# Patient Record
Sex: Male | Born: 2000 | Race: White | Hispanic: No | Marital: Single | Smoking: Never smoker
Health system: Southern US, Community
[De-identification: ages and names within clinical notes are randomized; demographics above are authoritative.]

## PROBLEM LIST (undated history)

## (undated) DIAGNOSIS — G43909 Migraine, unspecified, not intractable, without status migrainosus: Secondary | ICD-10-CM

## (undated) DIAGNOSIS — Z87710 Personal history of (corrected) hypospadias: Secondary | ICD-10-CM

## (undated) DIAGNOSIS — R21 Rash and other nonspecific skin eruption: Secondary | ICD-10-CM

## (undated) DIAGNOSIS — B351 Tinea unguium: Secondary | ICD-10-CM

## (undated) DIAGNOSIS — M9260 Juvenile osteochondrosis of tarsus, unspecified ankle: Secondary | ICD-10-CM

## (undated) HISTORY — DX: Juvenile osteochondrosis of tarsus, unspecified ankle: M92.60

## (undated) HISTORY — DX: Migraine, unspecified, not intractable, without status migrainosus: G43.909

## (undated) HISTORY — DX: Personal history of (corrected) hypospadias: Z87.710

## (undated) HISTORY — DX: Rash and other nonspecific skin eruption: R21

## (undated) HISTORY — DX: Tinea unguium: B35.1

---
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
## 2021-02-10 DIAGNOSIS — L408 Other psoriasis: Secondary | ICD-10-CM | POA: Diagnosis not present

## 2021-02-10 DIAGNOSIS — L409 Psoriasis, unspecified: Secondary | ICD-10-CM | POA: Diagnosis not present

## 2021-05-17 ENCOUNTER — Encounter: Payer: Self-pay | Admitting: Family Medicine

## 2021-06-19 IMAGING — DX DG HAND COMPLETE 3+V*R*
4 series · 4 of 4 positions shown · non-contrast
Comparison: None.

CLINICAL DATA: Right hand pain

EXAM:
RIGHT HAND - COMPLETE 3+ VIEW

[hand pa]
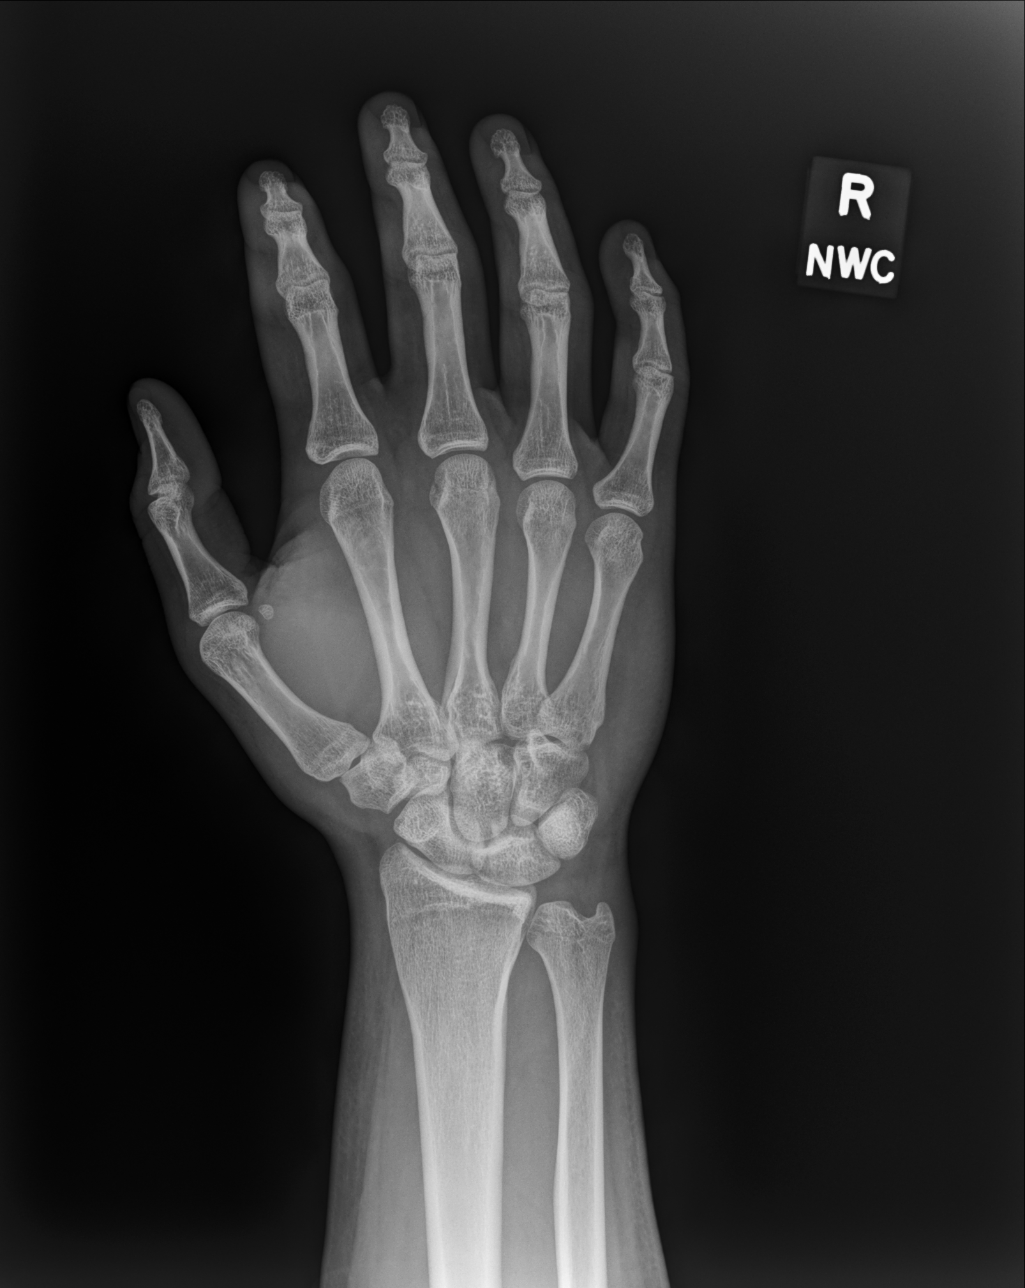

[hand oblique]
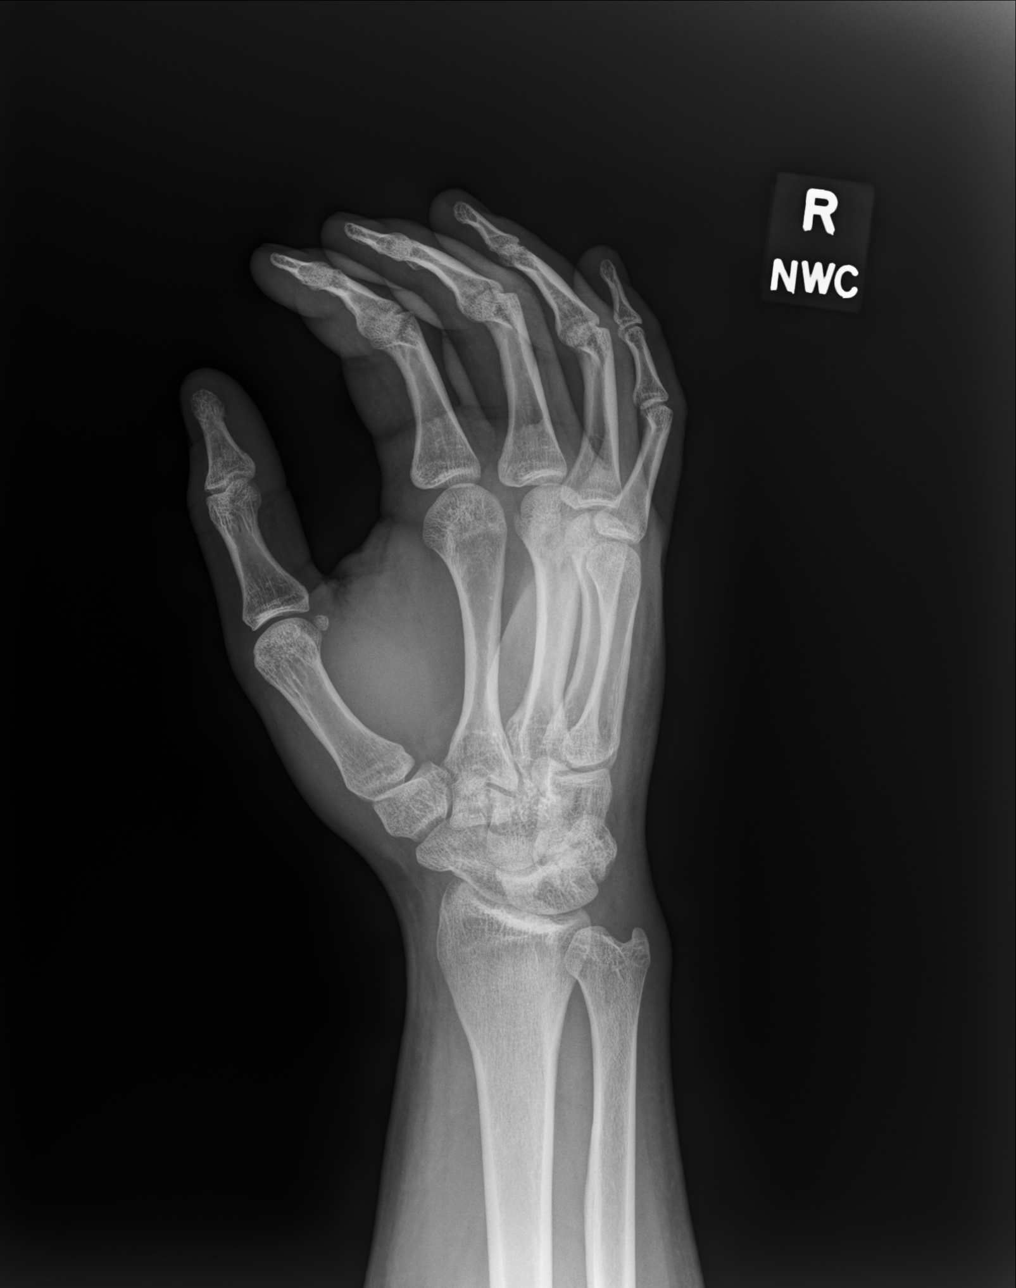

[hand lat]
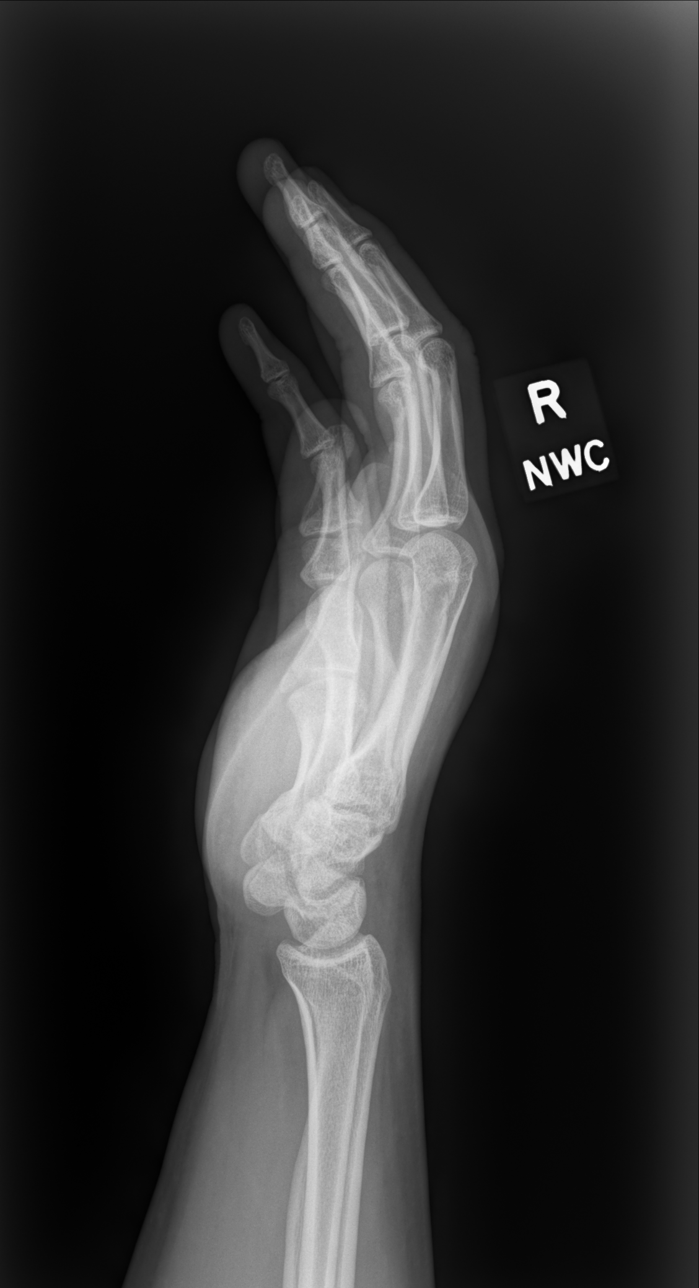

[navicular pa]
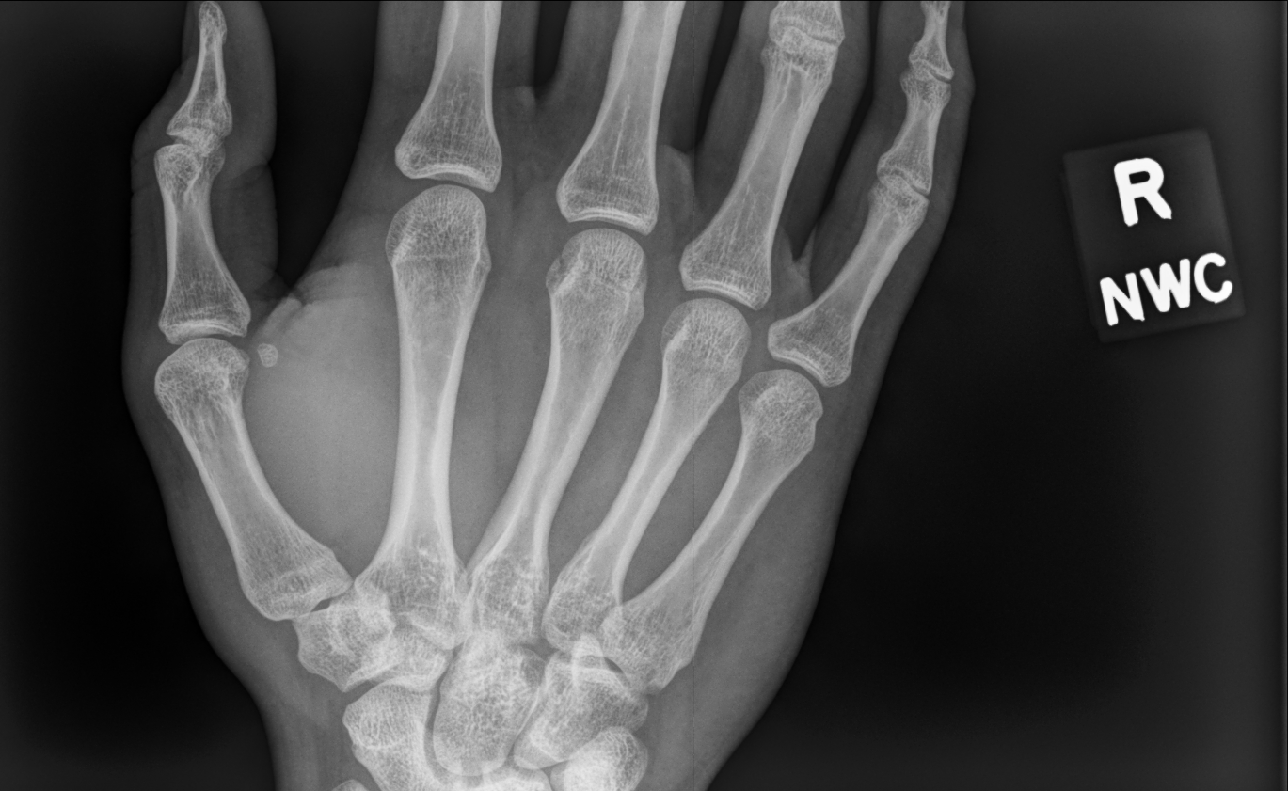

[4 of 4 positions shown; findings below may reference images not displayed]

FINDINGS: No subluxation or radiopaque foreign body. Questionable nondisplaced
fracture lucency at the head of the third metacarpal.
IMPRESSION: Questionable fracture lucency at the head of the third metacarpal.
Correlate for point tenderness to this region.

## 2021-10-21 DIAGNOSIS — R21 Rash and other nonspecific skin eruption: Secondary | ICD-10-CM | POA: Diagnosis not present

## 2021-10-21 DIAGNOSIS — J301 Allergic rhinitis due to pollen: Secondary | ICD-10-CM | POA: Diagnosis not present

## 2021-10-21 DIAGNOSIS — J3089 Other allergic rhinitis: Secondary | ICD-10-CM | POA: Diagnosis not present

## 2021-12-29 NOTE — Progress Notes (Signed)
? ?  I, Christoper Fabian, LAT, ATC, am serving as scribe for Dr. Clementeen Graham. ? ?Torion Hulgan is a 21 y.o. male who presents to [facility] at [facility] today for evaluation in preparation for joining the [employer].  He was last seen by Dr. Denyse Amass on 11/25/19 for a R 3rd MC fracture f/u and prior to that in 2019 by Dr. Katrinka Blazing for a concussion.  Today, pt reports that he went to [facility] last Friday and was told that he needs to provide them w/ his complete medication history (which he has taken care of) and documentation that he no longer has a concussion.  His R hand fracture from 2021 has completely healed and he is having no issue w/ that.  He is wanting to join the [employer]. ? ?He is completely asymptomatic from his concussion and from his hand fracture. ? ? ?Pertinent review of systems: No fevers or chills ? ?Relevant historical information: History of migraine ? ? ?Exam:  ?BP 104/72 (BP Location: Right Arm, Patient Position: Sitting, Cuff Size: Large)   Pulse 95   Ht 6\' 1"  (1.854 m)   Wt 222 lb 6.4 oz (100.9 kg)   SpO2 97%   BMI 29.34 kg/m?  ?General: Well Developed, well nourished, and in no acute distress.  ? ?MSK: Right hand normal.  Nontender normal hand motion ? ?Neuro alert and oriented normal coordination balance and gait.  Normal speech thought process and affect. ? ? ? ?Assessment and Plan: ?21 y.o. male with concussion history.  Concussion occurred in 2019 and is completely asymptomatic at this point.  Patient is cleared for active 2020 service per my professional medical opinion. ? ?Hand fracture: Clinically significantly improved.  Asymptomatic again at this point.  Again cleared for active [employer]. ? ?Letter written.  Recheck back with me as needed. ? ? ? ? ?Discussed warning signs or symptoms. Please see discharge instructions. Patient expresses understanding. ? ? ?The above documentation has been reviewed and is accurate and complete [employer], M.D. ? ? ?

## 2021-12-30 ENCOUNTER — Encounter: Payer: Self-pay | Admitting: Family Medicine

## 2021-12-30 ENCOUNTER — Ambulatory Visit (INDEPENDENT_AMBULATORY_CARE_PROVIDER_SITE_OTHER): Payer: BC Managed Care – PPO | Admitting: Family Medicine

## 2021-12-30 VITALS — BP 104/72 | HR 95 | Ht 73.0 in | Wt 222.4 lb

## 2021-12-30 DIAGNOSIS — Z8781 Personal history of (healed) traumatic fracture: Secondary | ICD-10-CM | POA: Diagnosis not present

## 2021-12-30 DIAGNOSIS — Z87820 Personal history of traumatic brain injury: Secondary | ICD-10-CM | POA: Diagnosis not present

## 2021-12-30 NOTE — Patient Instructions (Signed)
Good to see you. ? ?Congratulations on your efforts to join the [employer]. ? ?Follow-up as needed. ?

## 2022-06-27 ENCOUNTER — Encounter: Payer: Self-pay | Admitting: *Deleted

## 2022-09-15 ENCOUNTER — Encounter: Payer: Self-pay | Admitting: *Deleted

## 2024-02-01 ENCOUNTER — Encounter: Payer: Self-pay | Admitting: Internal Medicine

## 2024-02-01 ENCOUNTER — Other Ambulatory Visit: Payer: Self-pay

## 2024-02-01 ENCOUNTER — Encounter (HOSPITAL_COMMUNITY): Payer: Self-pay

## 2024-02-01 ENCOUNTER — Other Ambulatory Visit (HOSPITAL_BASED_OUTPATIENT_CLINIC_OR_DEPARTMENT_OTHER): Payer: Self-pay

## 2024-02-01 ENCOUNTER — Ambulatory Visit (INDEPENDENT_AMBULATORY_CARE_PROVIDER_SITE_OTHER): Payer: Self-pay | Admitting: Internal Medicine

## 2024-02-01 ENCOUNTER — Other Ambulatory Visit (HOSPITAL_COMMUNITY): Payer: Self-pay

## 2024-02-01 VITALS — BP 136/88 | HR 86 | Temp 98.4°F | Ht 73.0 in | Wt 247.4 lb

## 2024-02-01 DIAGNOSIS — L400 Psoriasis vulgaris: Secondary | ICD-10-CM | POA: Diagnosis not present

## 2024-02-01 MED ORDER — CLOBETASOL PROPIONATE 0.05 % EX CREA: 1.0000 | TOPICAL_CREAM | Freq: Two times a day (BID) | CUTANEOUS | 0 refills | Status: DC

## 2024-02-01 MED ORDER — OTEZLA 10 & 20 & 30 MG PO TBPK
ORAL_TABLET | ORAL | 2 refills | Status: DC
  Filled 2024-02-01: qty 55, 28d supply, fill #0

## 2024-02-01 NOTE — Patient Instructions (Signed)
???   5-Day Titration Schedule To minimize gastrointestinal side effects, initiate therapy with a gradual dose escalation:[email or web address] Day Morning Dose Evening Dose  1 10 mg --  2 10 mg 10 mg  3 10 mg 20 mg  4 20 mg 20 mg  5 20 mg 30 mg  ? Maintenance Dose (Day 6 and Beyond) After completing the titration, the maintenance dosage ZO:[email or web address] 30 mg orally twice daily (morning and evening)

## 2024-02-01 NOTE — Progress Notes (Unsigned)
 [facility]  Phone: [phone]  - Medical Office Visit -  Visit Date: 02/01/2024 Patient: Jeffery Warren   DOB: 2001-04-05   23 y.o. Male  MRN: [id number] Patient Care Team: Anthon Kins, MD as PCP - General (Internal Medicine) Today's Health Care Provider: Anthon Kins, MD  ===========================================    Chief Complaint / Reason for Visit: new pt (Pt is present for est care with pt. ) and Rash (Hx of psoriasis states he know that is what it is.)  Background: 23 y.o. male who has Migraines; Rash; Onychomycosis; Hypospadias; Concussion; and Closed fracture of right hand on their problem list. History of Present Illness 23 year old male with psoriasis who presents with worsening skin symptoms.  He has had psoriasis since the age of 86, primarily affecting his scalp, behind the ears, in the ears, and more recently, the eyebrows. The condition has been spreading and worsening, with increased redness, flakiness, and itchiness. He also notes possible acceleration of hair loss.  He was previously treated with Skyrizi for about six months, which he stopped one to two years ago when the pharmacy ceased filling the prescription. He recalls that the specialty pharmacy did not follow up after two uses, despite insurance coverage. He mentions a past staph infection behind his ears while on Skyrizi, possibly related to the medication.  Currently, he intermittently uses steroid creams to manage irritation and practices good scalp hygiene with showers and scrubbing. He has not tried Otezla  or other medications recently, but recalls using an alcohol-based foam topical treatment around the age of 12, which was expensive and not covered by insurance.  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 No joint involvement or back pain associated with his psoriasis.  Medications updated/reviewed: Current  Outpatient Medications on File Prior to Visit  Medication Sig   rizatriptan  (MAXALT ) 10 MG tablet Take 10 mg by mouth as needed for migraine. May repeat in 2 hours if needed (Patient not taking: Reported on 02/01/2024)   STELARA 90 MG/ML SOSY injection Inject 1 mL into the skin as directed. (Patient not taking: Reported on 02/01/2024)   No current facility-administered medications on file prior to visit.  There are no discontinued medications. Current Meds  Medication Sig   Apremilast  (OTEZLA ) 10 & 20 & 30 MG TBPK ??? 5-Day Titration Schedule To minimize gastrointestinal side effects, initiate therapy with a gradual dose escalation:[email or web address] Day Morning Dose Evening Dose 1 10 mg - 2 10 mg 10 mg 3 10 mg 20 mg 4 20 mg 20 mg 5 20 mg 30 mg ? Maintenance Dose (Day 6 and Beyond) After completing the titration, the maintenance dosage WG:[email or web address] 30 mg orally twice daily (morning and evening)   clobetasol  cream (TEMOVATE ) 0.05 % Apply 1 Application topically 2 (two) times daily.    Allergies:   Allergies as of 02/01/2024   (No Known Allergies)   Past Medical History:  has a past medical history of Migraines, Personal history of repaired hypospadias, Rash, Sever's disease, and Toenail fungus. Past Surgical History:   has a past surgical history that includes Hypospadius correction (2004); fiscal repair  penis (11/2018); and hyperspadia repair (2019). Social History:   reports that he has never smoked. He has never used smokeless tobacco. He reports current alcohol use. He reports that he does not use drugs. Family History:  family history includes Alcohol abuse in his maternal grandfather; Arthritis in his maternal  grandmother and paternal grandmother; Asthma in his mother; Cirrhosis in his maternal grandfather; Diabetes in his paternal grandmother; Heart disease in his maternal grandmother; Hyperlipidemia in his maternal grandmother; Hypertension in his mother and paternal  grandmother; Migraines in his brother and sister; Obesity in his paternal grandmother. Depression Screen and Health Maintenance:    02/01/2024    2:02 PM 11/01/2019    9:51 AM 02/04/2019    9:26 PM  PHQ 2/9 Scores  PHQ - 2 Score 0 0 0  PHQ- 9 Score 0     Health Maintenance  Topic Date Due   HIV Screening  Never done   Hepatitis C Screening  Never done   COVID-19 Vaccine (1 - 2024-25 season) Never done   DTaP/Tdap/Td (7 - Td or Tdap) 01/31/2025 (Originally 04/10/2023)   INFLUENZA VACCINE  05/03/2024   Pneumococcal Vaccine 109-37 Years old  Completed   HPV VACCINES  Completed   Meningococcal B Vaccine  Completed   Immunization History  Administered Date(s) Administered   DTaP 04/17/2002, 06/27/2002, 09/04/2002, 05/16/2003, 06/29/2006   HIB (PRP-OMP) 04/17/2002, 06/27/2002, 09/04/2002, 02/19/2003   HPV 9-valent 04/10/2018, 02/04/2019, 08/06/2019   Hepatitis A 09/13/2007, 10/24/2008   Hepatitis B 2001-10-[birth year], 04/17/2002, 09/04/2002   Hpv-Unspecified 08/17/2016   IPV 04/17/2002, 06/27/2002, 09/04/2002, 06/29/2006   Influenza,inj,Quad PF,6+ Mos 10/11/2017, 08/16/2019   MMR 02/19/2003, 06/29/2006   Meningococcal B, OMV 02/04/2019, 12/03/2019   Meningococcal Conjugate 04/09/2013   Meningococcal Mcv4o 12/03/2019   Pneumococcal Conjugate-13 04/17/2002, 06/27/2002, 09/04/2002, 09/18/2003   Tdap 04/09/2013   Varicella 02/19/2003, 06/29/2006     Objective   Physical ExamBP 136/88   Pulse 86   Temp 98.4 F (36.9 C) (Temporal)   Ht 6\' 1"  (1.854 m)   Wt 247 lb 6.4 oz (112.2 kg)   SpO2 98%   BMI 32.64 kg/m  Wt Readings from Last 10 Encounters:  02/01/24 247 lb 6.4 oz (112.2 kg)  12/30/21 222 lb 6.4 oz (100.9 kg) (97%, Z= 1.90)*  01/28/20 230 lb (104.3 kg) (99%, Z= 2.17)*  11/25/19 233 lb 12.8 oz (106.1 kg) (99%, Z= 2.25)*  11/18/19 234 lb (106.1 kg) (99%, Z= 2.26)*  11/18/19 234 lb 3.2 oz (106.2 kg) (99%, Z= 2.26)*  11/01/19 235 lb (106.6 kg) (99%, Z= 2.28)*  02/04/19 220 lb (99.8  kg) (98%, Z= 2.14)*  12/13/18 222 lb 3.2 oz (100.8 kg) (99%, Z= 2.21)*  07/04/18 206 lb (93.4 kg) (98%, Z= 2.00)*   * Growth percentiles are based on CDC (Boys, 2-20 Years) data.  Vital signs reviewed.  Nursing notes reviewed. Weight trend reviewed. Abnormalities and problem-specific physical exam findings:   General Appearance:  Well developed, well nourished, well-groomed, healthy-appearing male with Body mass index is 32.64 kg/m. No acute distress appreciable.   Skin: Clear and well-hydrated. Pulmonary:  Normal work of breathing at rest, no respiratory distress apparent. SpO2: 98 %  Musculoskeletal: He demonstrates smooth and coordinated movements throughout all major joints.All extremities are intact.  Neurological:  Awake, alert, oriented, and engaged.  No obvious focal neurological deficits or cognitive impairments.  Sensorium seems unclouded.  Psychiatric:  Appropriate mood, pleasant and cooperative demeanor, cheerful and engaged during the exam  Reviewed Results & Data Results     No results found for any visits on 02/01/24.  No visits with results within 1 Year(s) from this visit.  Latest known visit with results is:  Lab on 10/27/2020  Component Date Value   SARS-CoV-2, NAA 10/27/2020 Detected (A)    SARS-CoV-2, NAA 2  DAY TAT 10/27/2020 Performed    No image results found.   No results found.  Patient was never admitted. 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 Symptoms include spreading, increased erythema, flakiness, and pruritus. Previous Skyrizi treatment was discontinued due to pharmacy issues. He currently uses intermittent topical corticosteroids and has no joint involvement. Otezla  is preferred for its favorable safety profile and oral administration. Potential side  effects and insurance challenges were discussed. He prefers to avoid Sheilla Der and is open to Otezla . Informed consent included discussion of JAK inhibitors' risks and Otezla 's safety benefits. Medical decision-making favored Otezla  over Skyrizi in part because skyrizi wasn't fully effective previously. Submit prior authorization for Otezla . Prescribe clobetasol  for topical use on affected areas. Refer to dermatology for further evaluation and management. Advise him to contact the office if symptoms worsen or joint involvement occurs. Send prescription to [facility] pharmacy for insurance assistance and clobetasol  prescription to [facility].  Prior authorization support letter in communication section:   I am writing to request prior authorization for Otezla  (apremilast ) for my patient, [Patient's Full Name], who has been diagnosed with moderate-to-severe plaque psoriasis affecting the scalp, ears, face, and eyebrows. Despite previous treatment with Skyrizi (risankizumab) for six months, the patient discontinued therapy approximately 1-2 years ago due to insurance coverage issues. Since then, the patient's condition has worsened, leading to increased flakiness, itching, and accelerated hair loss. Clinical History and Current Condition Diagnosis: Moderate-to-severe plaque psoriasis Affected Areas: Scalp, ears, face, and eyebrows Current Symptoms: Increased flakiness, itching, and hair loss Previous Treatments: Skyrizi (risankizumab): Discontinued 1-2 years ago due to insurance coverage issues Topical Steroid Creams: Used intermittently with limited efficacy Topical Alcohol-Based Foam: Provided minimal relief but was prohibitively expensive Rationale for Otezla  (apremilast ) Otezla  (apremilast ) is an oral phosphodiesterase 4 (PDE4) inhibitor approved for the treatment of moderate-to-severe plaque psoriasis in adults who are candidates for phototherapy or systemic therapy. It is  considered appropriate for patients who have not responded adequately to topical treatments or other systemic therapies. Given the patient's history of inadequate response to topical therapies and the discontinuation of biologic treatment due to insurance issues, Otezla  offers a viable oral alternative that can be initiated promptly without the need for injections. Supporting Documentation Clinical Notes: Charles Connor relevant clinical notes detailing the patient's condition and treatment history] Photographs: [Attach photographs illustrating the severity of the affected areas] Previous Medication Records: [facility] records of prior treatments and their outcomes] Conclusion Initiating Otezla  (apremilast ) is medically necessary to manage the patient's worsening psoriasis and to prevent further complications, including potential scarring and permanent hair loss. I respectfully request that you approve this prior authorization request to facilitate timely and effective treatment for my patient. Should you require any additional information or documentation, please do not hesitate to contact my office at [facility] Number]. Thank you for your attention to this matter.   ED Discharge Orders          Ordered    Apremilast  (OTEZLA ) 10 & 20 & 30 MG TBPK       Note to Pharmacy: At refill shift to 30 mg twice daily continuous   02/01/24 1501    Ambulatory referral to Dermatology        02/01/24 1501    clobetasol  cream (TEMOVATE )  0.05 %  2 times daily        02/01/24 1501          Diagnoses and all orders for this visit: Plaque psoriasis -     Apremilast  (OTEZLA ) 10 & 20 & 30 MG TBPK; ??? 5-Day Titration Schedule To minimize gastrointestinal side effects, initiate therapy with a gradual dose escalation:[email or web address] Day Morning Dose Evening Dose 1 10 mg - 2 10 mg 10 mg 3 10 mg 20 mg 4 20 mg 20 mg 5 20 mg 30 mg ? Maintenance Dose (Day 6 and Beyond) After completing the titration, the maintenance dosage  VW:[email or web address] 30 mg orally twice daily (morning and evening) -     Ambulatory referral to Dermatology -     clobetasol  cream (TEMOVATE ) 0.05 %; Apply 1 Application topically 2 (two) times daily.   Recommended follow up: No follow-ups on file. Future Appointments  Date Time Provider Department Center  03/04/2024  3:00 PM Anthon Kins, MD [facility]     Medical Decision Making: 1 or more chronic illnesses with exacerbation,  progression, or side effects of treatment Prescription drug management   Prior authorize paperwork for otezla  completed as communication to prior authorization team I have personally spent 33 minutes involved in face-to-face and non-face-to-face activities for this patient on the day of the visit. Professional time spent includes the following activities: Preparing to see the patient (review of tests), Obtaining and/or reviewing separately obtained history (admission/discharge record), Performing a medically appropriate examination and/or evaluation , Ordering medications/tests/procedures, referring and communicating with other health care professionals, Documenting clinical information in the EMR, Independently interpreting results (not separately reported), Communicating results to the patient/family/caregiver, Counseling and educating the patient/family/caregiver and Care coordination (not separately reported).         Additional notes: This document was synthesized by artificial intelligence (Abridge) using HIPAA-compliant recording of the clinical interaction;   We discussed the use of AI scribe software for clinical note transcription with the patient, who gave verbal consent to proceed.    Additional Info: This encounter employed state-of-the-art, real-time, collaborative documentation. The patient actively reviewed and assisted in updating their electronic medical record on a shared screen, ensuring transparency and facilitating joint  problem-solving for the problem list, overview, and plan. This approach promotes accurate, informed care. The treatment plan was discussed and reviewed in detail, including medication safety, potential side effects, and all patient questions. We confirmed understanding and comfort with the plan. Follow-up instructions were established, including contacting the office for any concerns, returning if symptoms worsen, persist, or new symptoms develop, and precautions for potential emergency department visits.  Initial Appointment Goals:  This initial visit focused on establishing a foundation for the patient's care. We collaboratively reviewed his medical history and medications in detail, updating the chart as shown in the encounter. Given the extensive information, we prioritized addressing his most pressing concerns, which he reported were: new pt (Pt is present for est care with pt. ) and Rash (Hx of psoriasis states he know that is what it is.)  While the complexity of the patient's medical picture may necessitate further evaluation in subsequent visits, we were able to develop a preliminary care plan together. To expedite a comprehensive plan at the next visit, we encouraged the patient to gather relevant medical records from previous providers. This collaborative approach will ensure a more complete understanding of the patient's health and inform the development of a personalized care plan. We look forward to continuing the conversation and  working together with the patient on achieving his health goals.   Collaborative Documentation:  Today's encounter utilized real-time, dynamic patient engagement.  Patients actively participate by directly reviewing and assisting in updating their medical records through a shared screen. This transparency empowers patients to visually confirm chart updates made by the healthcare provider.  This collaborative approach facilitates problem management as we jointly update  the problem list, problem overview, and assessment/plan. Ultimately, this process enhances chart accuracy and completeness, fostering shared decision-making, patient education, and informed consent for tests and treatments.  Collaborative Treatment Planning:  Treatment plans were discussed and reviewed in detail.  Explained medication safety and potential side effects.  Encouraged participation and answered all patient questions, confirming understanding and comfort with the plan. Encouraged patient to contact our office if they have any questions or concerns. Agreed on patient returning to office if symptoms worsen, persist, or new symptoms develop.  ----------------------------------------------------- Anthon Kins, MD  02/03/2024 11:58 AM  [facility]:  [phone]

## 2024-02-02 ENCOUNTER — Other Ambulatory Visit: Payer: Self-pay

## 2024-02-03 ENCOUNTER — Encounter: Payer: Self-pay | Admitting: Internal Medicine

## 2024-02-07 ENCOUNTER — Telehealth: Payer: Self-pay

## 2024-02-07 ENCOUNTER — Other Ambulatory Visit: Payer: Self-pay

## 2024-02-07 DIAGNOSIS — L400 Psoriasis vulgaris: Secondary | ICD-10-CM

## 2024-02-07 MED ORDER — OTEZLA 10 & 20 & 30 MG PO TBPK
ORAL_TABLET | ORAL | 2 refills | Status: AC
  Filled 2024-02-07: qty 55, 25d supply, fill #0

## 2024-02-07 MED ORDER — OTEZLA 10 & 20 & 30 MG PO TBPK: ORAL_TABLET | ORAL | 2 refills | Status: DC

## 2024-02-07 NOTE — Telephone Encounter (Signed)
 Please review and advise   Copied from CRM [id number]. Topic: Clinical - Prescription Issue >> Feb 07, 2024  9:34 AM Lizabeth Riggs wrote: Reason for CRM:  Apremilast  (OTEZLA ) 10 & 20 & 30 MG TBPK - [facility] needs a new order that states how to take this medication. The order they have is unclear on how to take. Please send new order. Thanks

## 2024-02-07 NOTE — Addendum Note (Signed)
 Addended by: Rhen Dossantos G on: 02/07/2024 11:01 AM   Modules accepted: Orders

## 2024-02-09 ENCOUNTER — Other Ambulatory Visit (HOSPITAL_COMMUNITY): Payer: Self-pay

## 2024-02-09 ENCOUNTER — Telehealth: Payer: Self-pay

## 2024-02-09 NOTE — Progress Notes (Signed)
 Unfortunately, based on the questions asked for the PA and the information we currently have, PA will likely result in denial, please advise. Thank you

## 2024-02-09 NOTE — Telephone Encounter (Signed)
 Pharmacy Patient Advocate Encounter   Received notification from Physician's Office that prior authorization for Otezla  10 & 20 & 30MG  tablets is required/requested.   Insurance verification completed.   The patient is insured through [employer] .   Per test claim: PA required, began PA in [facility], but will likely result in denial, reached out to provider in original Cc'd chart request

## 2024-02-21 ENCOUNTER — Other Ambulatory Visit (HOSPITAL_COMMUNITY): Payer: Self-pay

## 2024-03-04 ENCOUNTER — Encounter: Payer: Self-pay | Admitting: Internal Medicine

## 2024-03-04 ENCOUNTER — Ambulatory Visit (INDEPENDENT_AMBULATORY_CARE_PROVIDER_SITE_OTHER): Payer: Self-pay | Admitting: Internal Medicine

## 2024-03-04 VITALS — BP 126/76 | HR 99 | Temp 98.0°F | Ht 73.0 in | Wt 244.2 lb

## 2024-03-04 DIAGNOSIS — Z0001 Encounter for general adult medical examination with abnormal findings: Secondary | ICD-10-CM | POA: Diagnosis not present

## 2024-03-04 DIAGNOSIS — E65 Localized adiposity: Secondary | ICD-10-CM | POA: Diagnosis not present

## 2024-03-04 DIAGNOSIS — L409 Psoriasis, unspecified: Secondary | ICD-10-CM | POA: Insufficient documentation

## 2024-03-04 NOTE — Assessment & Plan Note (Signed)
 Encouraged weight loss

## 2024-03-04 NOTE — Patient Instructions (Addendum)
 Managing Your Health Over Time  Managing every aspect of your health in a single visit isn't always feasible, but that's okay.  We addressed many preventive issues today and charted a course for future care. Acute conditions or preventive care measures may require further attention.  We encourage you to schedule a follow-up visit at your earliest convenience to discuss any unresolved issues.  We strongly encourage continued participation in annual preventive care visits to help us  develop a more thorough understanding of your health and to help you maintain optimal wellness - please inquire about scheduling your next one with us  at your earliest convenience.  Next Steps  [x]   Schedule Follow-Up:  We recommend a follow-up appointment in 1 year for your next wellness visit.  If you develop any new problems, want to address any medical issues, or your condition worsens before then, please call us  for an appointment or seek emergency care. [x]   Preventive Care:  Make sure to keep regular appointments with dental and vision professionals, use nightly nasal saline mist sprays to keep your sinuses clear and toothbrushing to protect your teeth. Use Snore[employer] App or other app to track your sleep quality. Check blood pressure and heart rate routinely. [x]   Medical Information Release:  For any relevant medical information we don't have, please sign a release form at the front desk so we can obtain it for your records. [x]   Lab Tests:  Schedule any lab tests from today for within a week to ensure best insurance coverage.    Making the Most of Our Focused (20 minute) Appointments:  [x]   Clearly state your top concerns at the beginning of the visit to focus our discussion [x]   If you anticipate you will need more time, please inform the front desk during scheduling - we can book multiple appointments in the same week. [x]   If you have transportation problems- use our convenient video appointments or ask about  transportation support. [x]   We can get down to business faster if you use MyChart to update information before the visit and submit non-urgent questions before your visit. Thank you for taking the time to provide details through MyChart.  Let our nurse know and she can import this information into your encounter documents.  Arrival and Wait Times: [x]   Arriving on time ensures that everyone receives prompt attention. [x]   Early morning (8a) and afternoon (1p) appointments tend to have shortest wait times. [x]   Unfortunately, we cannot delay appointments for late arrivals or hold slots during phone calls.  Bring to Your Next Appointment  [x]   Medications: Please bring all your medication bottles to your next appointment to ensure we have an accurate record of your prescriptions. [x]   Health Diaries: If you're monitoring any health conditions at home, keeping a diary of your readings can be very helpful for discussions at your next appointment.  Reviewing Your Records  [x]   Review your attached preventive care information at the end of these patient instructions. [x]   Review this early draft of your clinical encounter notes below and the final encounter summary tomorrow on MyChart after its been completed.   Encounter for annual general medical examination with abnormal findings in adult -     Lipid panel -     Comprehensive metabolic panel with GFR -     CBC with Differential/Platelet -     TSH Rfx on Abnormal to Free T4 -     Hemoglobin A1c  Psoriasis -  Ambulatory referral to Dermatology  Central adiposity -     TSH Rfx on Abnormal to Free T4 -     Hemoglobin A1c     Getting Answers and Following Up  [x]   Simple Questions & Concerns: For quick questions or basic follow-up after your visit, reach us  at [phone] or MyChart messaging. [x]   Complex Concerns: If your concern is more complex, scheduling an appointment might be best. Discuss this with the staff to find the  most suitable option. [x]   Lab & Imaging Results: We'll contact you directly if results are abnormal or you don't use MyChart. Most normal results will be on MyChart within 2-3 business days, with a review message from Dr. Boston Byers. Haven't heard back in 2 weeks? Need results sooner? Contact us  at [phone]. [x]   Referrals: Our referral coordinator will manage specialist referrals. The specialist's office should contact you within 2 weeks to schedule an appointment. Call us  if you haven't heard from them after 2 weeks.  Staying Connected  [x]   MyChart: Activate your MyChart for the fastest way to access results and message us . See the last page of this paperwork for instructions on how to activate.  Billing  [x]   X-ray & Lab Orders: These are billed by separate companies. Contact the invoicing company directly for questions or concerns. [x]   Visit Charges: Discuss any billing inquiries with our administrative services team.  Your Satisfaction Matters  [x]   Share Your Experience: We strive for your satisfaction! If you have any complaints, or preferably compliments, please let Dr. Boston Byers know directly or contact our Practice Administrators, Olinda Bertrand or [employer], by asking at the front desk.        ?? STREAMLINED PROCESS: To make this efficient and fair to everyone's time, please choose one of the following options below.  CHOOSE YOUR PREFERRED OPTION   ?? OPTION 1: Draft It Yourself (with AI help) Perfect if you: Want to get started right away and are comfortable using AI tools Steps: Visit ChatGPT or another AI tool Use your insurance company's criteria to create an initial letter Copy your draft into a MyChart reply Include relevant documentation (sleep study, test results, etc.) ? BENEFIT: I'll review, polish, and submit--no visit required!  ?? OPTION 2: Schedule a Brief Visit Perfect if you: Prefer to work through details together in person What we'll do: Review  your medical history together Discuss insurance requirements Create and refine the letter during our visit I'll submit it immediately after our appointment ? TIME NEEDED: Just 15 minutes of focused time together    ?? NEXT STEP - PLEASE REPLY: For "Option 1": Reply to this message with your premade documentation  For "Option 2" please schedule directly through MyChart Appointments or call our office.         My Commitment to You: I'm here to advocate for you, and this process helps ensure I have the time and information needed to build the strongest possible request on your behalf. Together, we'll get you the coverage you deserve.  Thank you for your understanding and for working with me to streamline your care. ?? QUICK MYCHART LINKS:  Reply to this message: Messages ? Reply  Schedule appointment: Appointments ? Schedule New  View your records: Medical Record ? Test Results    ?? MYCHART ACCESS: Visit [email or web address] or use the MyChart mobile app  Working together for your health,      Anthon Kins, MD Board Certified in Internal Medicine  [facility] ?? [phone]  ?? [phone]  ?? [address] [place], [place], [place] [address]

## 2024-03-04 NOTE — Progress Notes (Signed)
 [facility] at [facility] [address] [place], [place] [address] Office:  [phone]  -- Annual Preventive Medical Office Visit --  Patient:  Jeffery Warren      Age: 23 y.o.       Sex:  male  Date:   03/04/2024 Patient Care Team: Anthon Kins, MD as PCP - General (Internal Medicine) Today's Healthcare Provider: Anthon Kins, MD  ========================================= Chief complaint: Annual Exam (Pt is present for cpe. Has question on ozempic as well.)  Purpose of Visit: Comprehensive preventive health assessment and personalized health maintenance planning.  This encounter was conducted as a Comprehensive Physical Exam (CPE) preventive care annual visit. The patient's medical history and problem list were reviewed to inform individualized preventive care recommendations.  No problem-specific medical treatment was provided during this visit.     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 Insurance requires a dermatologist's consultation for Apremilast  (Otezla ) approval. No spine pain or systemic symptoms are present. Refer to a dermatologist to facilitate insurance approval for Apremilast . Continue clobetasol  cream 0.05% topically twice daily as needed, using pulse treatment to prevent skin thinning. Order baseline blood work for psoriasis medication. Central adiposity Encouraged weight loss    Diagnoses and all orders for this visit: Encounter for annual general medical examination with abnormal findings in adult -     Lipid panel -     Comprehensive metabolic panel with GFR -     CBC with Differential/Platelet -     TSH Rfx on Abnormal to Free T4 -      HgB A1c Psoriasis -     Ambulatory referral to Dermatology Central adiposity -     TSH Rfx on Abnormal to Free T4 -     HgB A1c   Reviewed/updated/encouraged completion: Immunization History  Administered Date(s) Administered   DTaP 04/17/2002, 06/27/2002, 09/04/2002, 05/16/2003, 06/29/2006   HIB (PRP-OMP) 04/17/2002, 06/27/2002, 09/04/2002, 02/19/2003   HPV 9-valent 04/10/2018, 02/04/2019, 08/06/2019   Hepatitis A 09/13/2007, 10/24/2008   Hepatitis B 2002/07/27, 04/17/2002, 09/04/2002   Hpv-Unspecified 08/17/2016   IPV 04/17/2002, 06/27/2002, 09/04/2002, 06/29/2006   Influenza,inj,Quad PF,6+ Mos 10/11/2017, 08/16/2019   MMR 02/19/2003, 06/29/2006   Meningococcal B, OMV 02/04/2019, 12/03/2019   Meningococcal Conjugate 04/09/2013   Meningococcal Mcv4o 12/03/2019   Pneumococcal Conjugate-13 04/17/2002, 06/27/2002, 09/04/2002, 09/18/2003   Tdap 04/09/2013   Varicella 02/19/2003, 06/29/2006   There are no preventive care reminders to display for this patient. Health Maintenance  Topic Date Due   COVID-19 Vaccine (1 - 2024-25 season) 03/20/2024 (Originally 06/04/2023)   DTaP/Tdap/Td (7 - Td or Tdap) 01/31/2025 (Originally 04/10/2023)   Hepatitis C Screening  03/04/2025 (Originally 02/09/2020)   HIV Screening  03/04/2025 (Originally 02/08/2017)   INFLUENZA VACCINE  05/03/2024   Pneumococcal Vaccine 22-32 Years old  Completed   HPV VACCINES  Completed   Meningococcal B Vaccine  Completed    Reviewed the following verbally with patient and provided AVS materials:  HEALTH MAINTENANCE COUNSELING AND ANTICIPATORY GUIDANCE   Preventive Measure Recommendation  Eye Exams Every 1-2 years  Dental Care Cleanings every 6 months or more, brush/floss 3x daily  Sinus Care Saline spray rinses daily  Sleep 8 hours nightly, good sleep hygiene, e-monitoring if any daytime drowsiness  Diet Fruits/vegetables/fiber/healthy fats,  balance and moderation  Exercise 150 minutes weekly  Risk Behaviors  Discouraged any/all high risk behaviors   CANCER SCREENING SHARED DECISION MAKING   Penile/Testicle/Scrotum Encouraged self-monitoring and reporting of genital abnormalities. Patient reports none.  Thyroid Checked and advised to palpate thyroid for nodules    Colon HM Colonoscopy   This patient has no relevant Health Maintenance data.     Lung Current guidelines recommend individuals aged 53 to 74 who currently smoke or formerly smoked and have a >= 20 pack-year smoking history should undergo annual screening with low-dose computed tomography (LDCT). Tobacco Use: Low Risk  (03/04/2024)   Patient History    Smoking Tobacco Use: Never    Smokeless Tobacco Use: Never    Passive Exposure: Not on file    Skin Advised regular sunscreen use. Patient denies worrisome, changing, or new skin lesions. Offered to include images in chart for surveillance. Showed patient these pictures of melanomas for reference to educate for self-monitoring.  Other Cancers Discussed lack of screening guidelines and insurance coverage for other cancer types.      Discussed the use of AI scribe software for clinical note transcription with the patient, who gave verbal consent to proceed.  History of Present Illness  23 year old male who presents for an annual physical exam.  He has chronic psoriasis, which he manages with clobetasol  cream applied twice a week to avoid skin thinning. The psoriasis on his forehead has improved with this treatment, and it has been effective in reducing itchiness.  He is experiencing issues with insurance coverage for Otezla , which was denied because it is not being used in combination with a biologic. He requires a dermatologist's consultation for approval but has not yet scheduled an appointment.  No spine pain, kidney issues, or tenderness. No lumps or bumps in the genital region. He experiences mild allergies and occasional epistaxis but has no other skin conditions besides  psoriasis.  He maintains a healthy lifestyle, with a diet free of seed oils and junk food, and drinks alcohol rarely. He does not use drugs and practices safe sex. He sleeps well, falling asleep quickly and waking up at the same time daily, with no daytime drowsiness. He drinks a significant amount of water daily and exercises regularly.    ROS A comprehensive ROS was negative for any concerning symptoms.  Completed medication reconciliation: Current Outpatient Medications on File Prior to Visit  Medication Sig   Apremilast  (OTEZLA ) 10 & 20 & 30 MG TBPK Sig: Initiate with a 5-day titration: Day 1: 10 mg orally in the morning Day 2: 10 mg orally in the morning and evening Day 3: 10 mg orally in the morning; 20 mg orally in the evening Day 4: 20 mg orally in the morning and evening Day 5: 20 mg orally in the morning; 30 mg orally in the evening Day 6 and thereafter: 30 mg orally twice daily (morning and evening)   clobetasol  cream (TEMOVATE ) 0.05 % Apply 1 Application topically 2 (two) times daily.   rizatriptan  (MAXALT -MLT) 10 MG disintegrating tablet Take 10 mg by mouth.   No current facility-administered medications on file prior to visit.  There are no discontinued medications.The following were reviewed and/or entered/updated into our electronic MEDICAL RECORD NUMBERPast Medical History:  Diagnosis Date   Migraines    Personal history of repaired hypospadias    Rash    Sever's disease    calcaneal apophysitis   Toenail fungus    Past Surgical History:  Procedure  Laterality Date   fiscal repair  penis  11/2018   hyperspadia repair  2019   HYPOSPADIAS CORRECTION  2004   Social History   Socioeconomic History   Marital status: Single    Spouse name: Not on file   Number of children: Not on file   Years of education: Not on file   Highest education level: Not on file  Occupational History   Not on file  Tobacco Use   Smoking status: Never   Smokeless tobacco: Never  Vaping Use    Vaping status: Never Used  Substance and Sexual Activity   Alcohol use: Yes   Drug use: No   Sexual activity: Yes    Birth control/protection: Condom  Other Topics Concern   Not on file  Social History Narrative   Grad from [employer] 2021?   [employer] classes I believe.    Does well in school for most part- struggles in spanish. Likes math, doesn't love reading.       Plays football, enjoys video games. [employer]   Social Drivers of Corporate investment banker Strain: Not on file  Food Insecurity: Not on file  Transportation Needs: Not on file  Physical Activity: Not on file  Stress: Not on file  Social Connections: Not on file  Intimate Partner Violence: Not on file       No data to display         Family History  Problem Relation Age of Onset   Hypertension Mother    Asthma Mother    Migraines Sister    Migraines Brother        abdominal   Arthritis Maternal Grandmother    Hyperlipidemia Maternal Grandmother    Heart disease Maternal Grandmother        no heart attack- states tachycardia related illness   Alcohol abuse Maternal Grandfather    Cirrhosis Maternal Grandfather    Arthritis Paternal Grandmother    Diabetes Paternal Grandmother    Hypertension Paternal Grandmother    Obesity Paternal Grandmother   No Known Allergies Social History   Substance and Sexual Activity  Sexual Activity Yes   Birth control/protection: Condom   Social History   Tobacco Use   Smoking status: Never   Smokeless tobacco: Never  Vaping Use   Vaping status: Never Used  Substance Use Topics   Alcohol use: Yes   Drug use: No      03/04/2024    2:54 PM  Depression screen PHQ 2/9  Decreased Interest 0  Down, Depressed, Hopeless 0  PHQ - 2 Score 0  Altered sleeping 0  Tired, decreased energy 0  Change in appetite 0  Feeling bad or failure about yourself  0  Trouble concentrating 0  Moving slowly or fidgety/restless 0  Suicidal thoughts 0  PHQ-9 Score 0  Difficult  doing work/chores Not difficult at all       No data to display           BP 126/76   Pulse 99   Temp 98 F (36.7 C) (Temporal)   Ht 6\' 1"  (1.854 m)   Wt 244 lb 3.2 oz (110.8 kg)   SpO2 98%   BMI 32.22 kg/m  BP Readings from Last 3 Encounters:  03/04/24 126/76  02/01/24 136/88  12/30/21 104/72   Wt Readings from Last 10 Encounters:  03/04/24 244 lb 3.2 oz (110.8 kg)  02/01/24 247 lb 6.4 oz (112.2 kg)  12/30/21 222 lb 6.4 oz (  100.9 kg) (97%, Z= 1.90)*  01/28/20 230 lb (104.3 kg) (99%, Z= 2.17)*  11/25/19 233 lb 12.8 oz (106.1 kg) (99%, Z= 2.25)*  11/18/19 234 lb (106.1 kg) (99%, Z= 2.26)*  11/18/19 234 lb 3.2 oz (106.2 kg) (99%, Z= 2.26)*  11/01/19 235 lb (106.6 kg) (99%, Z= 2.28)*  02/04/19 220 lb (99.8 kg) (98%, Z= 2.14)*  12/13/18 222 lb 3.2 oz (100.8 kg) (99%, Z= 2.21)*   * Growth percentiles are based on CDC (Boys, 2-20 Years) data.  Physical Exam Physical Exam HEENT: Nose normal, no deformities. Ears normal, no psoriasis. NECK: No cervical or occipital lymphadenopathy. ABDOMEN: No costovertebral angle tenderness. No abdominal distention. NEUROLOGICAL: Balance normal. Finger to nose test normal. Unverbalized: GEN: No acute distress, resting comfortably. HEENT: Tympanic membranes normal appearing bilaterally, oropharynx clear, no thyromegaly noted, no palpable lymphadenopathy or thyroid nodules. CARDIOVASCULAR: S1 and S2 heart sounds with regular rate and rhythm, no murmurs appreciated. PULMONARY: Normal work of breathing, clear to auscultation bilaterally, no crackles, wheezes, or rhonchi. ABDOMEN: Soft, nontender, nondistended. MSK: No edema, cyanosis, or clubbing noted. SKIN: Warm, dry, no lesions of concern observed. NEUROLOGICAL: Cranial nerves II-XII grossly intact, strength 5/5 in upper and lower extremities, reflexes symmetric and intact bilaterally. PSYCH: Normal affect and thought content, pleasant and  cooperative.      ====================================== IMPORTANT HEALTH REMINDERS: Report any new or changing skin lesions promptly Maintain recommended screening schedules Discuss any new family history of cancer at future visits Follow up on any new symptoms that persist more than two weeks     Notes:  This document was synthesized by artificial intelligence (Abridge) using HIPAA-compliant recording of the clinical interaction;   We discussed the use of AI scribe software for clinical note transcription with the patient, who gave verbal consent to proceed.    This encounter employed state-of-the-art, real-time, collaborative documentation. The patient was empowered to actively review and assist in updating their electronic medical record on a shared monitor, ensuring transparency and improving accuracy.    Prior to and at the beginning of Comprehensive Physical Exam (CPE) preventive care annual visit appointment types  we clarify to patients "Our goal today is to focus on your preventive or annual Comprehensive Physical Exam (CPE) preventive care annual visit, which typically covers routine screenings and overall health maintenance. However, if you share any new or concerning symptoms--such as dizziness, passing out, severe pain, or anything else that may point to a more serious issue--we are both legally and ethically required to evaluate it. We cannot simply overlook or ignore such concerns, even if you later decide you don't want to discuss them, because it could jeopardize your health.  If addressing a new concern takes us  beyond the scope of the preventive visit, we may need to bill separately for that portion of care. We understand financial considerations are important, and we're happy to discuss your options if something new comes up. However, we want to be clear that once you mention a potentially serious issue, we must investigate it; we can't ethically or legally exclude that from  our records or our evaluation. Please let us  know all of your questions or worries. Together, we can decide how best to manage them and how to minimize any unexpected costs, but we want to keep you safe above all else."   This disclosure is mandated by professional ethics and legal obligations, as healthcare providers must address any substantial health concerns raised during any patient interaction and a comprehensive ROS is required  by insurance companies for billing preventive-care visit type.   This disclosure ultimately discourages patients financially from reporting significant health issues.

## 2024-03-04 NOTE — Assessment & Plan Note (Signed)
 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 Insurance requires a dermatologist's consultation for Apremilast  (Otezla ) approval. No spine pain or systemic symptoms are present. Refer to a dermatologist to facilitate insurance approval for Apremilast . Continue clobetasol  cream 0.05% topically twice daily as needed, using pulse treatment to prevent skin thinning. Order baseline blood work for psoriasis medication.

## 2024-03-05 ENCOUNTER — Ambulatory Visit: Payer: Self-pay | Admitting: Internal Medicine

## 2024-03-05 LAB — CBC WITH DIFFERENTIAL/PLATELET
Basophils Absolute: 0 10*3/uL (ref 0.0–0.1)
Basophils Relative: 0.7 % (ref 0.0–3.0)
Eosinophils Absolute: 0.4 10*3/uL (ref 0.0–0.7)
Eosinophils Relative: 5.7 % — ABNORMAL HIGH (ref 0.0–5.0)
HCT: 45.2 % (ref 39.0–52.0)
Hemoglobin: 15.4 g/dL (ref 13.0–17.0)
Lymphocytes Relative: 30.4 % (ref 12.0–46.0)
Lymphs Abs: 1.9 10*3/uL (ref 0.7–4.0)
MCHC: 34.1 g/dL (ref 30.0–36.0)
MCV: 85 fl (ref 78.0–100.0)
Monocytes Absolute: 0.5 10*3/uL (ref 0.1–1.0)
Monocytes Relative: 8.4 % (ref 3.0–12.0)
Neutro Abs: 3.5 10*3/uL (ref 1.4–7.7)
Neutrophils Relative %: 54.8 % (ref 43.0–77.0)
Platelets: 302 10*3/uL (ref 150.0–400.0)
RBC: 5.31 Mil/uL (ref 4.22–5.81)
RDW: 12.7 % (ref 11.5–15.5)
WBC: 6.3 10*3/uL (ref 4.0–10.5)

## 2024-03-05 LAB — COMPREHENSIVE METABOLIC PANEL WITH GFR
ALT: 37 U/L (ref 0–53)
AST: 29 U/L (ref 0–37)
Albumin: 4.9 g/dL (ref 3.5–5.2)
Alkaline Phosphatase: 48 U/L (ref 39–117)
BUN: 16 mg/dL (ref 6–23)
CO2: 26 meq/L (ref 19–32)
Calcium: 9.4 mg/dL (ref 8.4–10.5)
Chloride: 103 meq/L (ref 96–112)
Creatinine, Ser: 0.99 mg/dL (ref 0.40–1.50)
GFR: 108.47 mL/min (ref >60.00)
Glucose, Bld: 87 mg/dL (ref 70–99)
Potassium: 4.6 meq/L (ref 3.5–5.1)
Sodium: 136 meq/L (ref 135–145)
Total Bilirubin: 1 mg/dL (ref 0.2–1.2)
Total Protein: 7.2 g/dL (ref 6.0–8.3)

## 2024-03-05 LAB — LIPID PANEL
Cholesterol: 152 mg/dL (ref 0–200)
HDL: 30.9 mg/dL — ABNORMAL LOW (ref >39.00)
LDL Cholesterol: 81 mg/dL (ref 0–99)
NonHDL: 120.6
Total CHOL/HDL Ratio: 5
Triglycerides: 199 mg/dL — ABNORMAL HIGH (ref 0.0–149.0)
VLDL: 39.8 mg/dL (ref 0.0–40.0)

## 2024-03-05 LAB — HEMOGLOBIN A1C: Hgb A1c MFr Bld: 5.2 % (ref 4.6–6.5)

## 2024-03-05 LAB — TSH RFX ON ABNORMAL TO FREE T4: TSH: 1.86 u[IU]/mL (ref 0.450–4.500)

## 2024-03-05 NOTE — Progress Notes (Signed)
 Abnormal blood counts can be ignored or minor changes cholesterol is also minor but I did send the patient message to help with understanding why it matters

## 2024-03-06 ENCOUNTER — Other Ambulatory Visit: Payer: Self-pay

## 2024-03-11 ENCOUNTER — Other Ambulatory Visit: Payer: Self-pay

## 2024-03-15 ENCOUNTER — Other Ambulatory Visit: Payer: Self-pay

## 2024-03-18 ENCOUNTER — Other Ambulatory Visit: Payer: Self-pay

## 2024-03-18 NOTE — Progress Notes (Signed)
 Last note shows PA started, but provider was informed denial is likely on 5/9. No updated notes from provider since then. Dis-enrolling for now.

## 2024-03-20 ENCOUNTER — Ambulatory Visit: Admitting: Physician Assistant

## 2024-03-20 ENCOUNTER — Encounter: Payer: Self-pay | Admitting: Physician Assistant

## 2024-03-20 VITALS — BP 132/85

## 2024-03-20 DIAGNOSIS — L409 Psoriasis, unspecified: Secondary | ICD-10-CM

## 2024-03-20 MED ORDER — CLOBETASOL PROPIONATE 0.05 % EX SHAM: 1.0000 | MEDICATED_SHAMPOO | CUTANEOUS | 2 refills | Status: DC

## 2024-03-20 MED ORDER — CLOBETASOL PROPIONATE 0.05 % EX SOLN: 1.0000 | Freq: Two times a day (BID) | CUTANEOUS | 4 refills | Status: DC

## 2024-03-20 MED ORDER — KETOCONAZOLE 2 % EX SHAM
1.0000 | MEDICATED_SHAMPOO | CUTANEOUS | 2 refills | Status: AC
Start: 2024-03-20 — End: ?

## 2024-03-20 MED ORDER — TRIAMCINOLONE ACETONIDE 0.1 % EX OINT: 1.0000 | TOPICAL_OINTMENT | Freq: Two times a day (BID) | CUTANEOUS | 2 refills | Status: AC

## 2024-03-20 NOTE — Patient Instructions (Signed)

## 2024-03-20 NOTE — Progress Notes (Signed)
   Follow-Up Visit   Subjective  Jeffery Warren is a 23 y.o. male who presents for the following: He was diagnosed with psoriasis about 8 years ago. It affects his scalp, face and ears. His PCP gave him clobetasol  cream. He was on Skyrizi for about 6 months but the specialty pharmacy dropped the ball and he was not able to get it. He is not interested in restarting Skyrizi at this time. His PCP discussed maybe starting Otezla  but he would like to see if topicals will work first. Denies joint pains.     The following portions of the chart were reviewed this encounter and updated as appropriate: medications, allergies, medical history  Review of Systems:  No other skin or systemic complaints except as noted in HPI or Assessment and Plan.  Objective  Well appearing patient in no apparent distress; mood and affect are within normal limits.   A focused examination was performed of the following areas: Face, scalp, ears, neck, abdomen, elbows and knees.    Relevant exam findings are noted in the Assessment and Plan.    Assessment & Plan   PSORIASIS Exam: Well-demarcated erythematous papules/plaques with silvery scale.    Psoriasis is a chronic non-curable, but treatable genetic/hereditary disease that may have other systemic features affecting other organ systems such as joints (Psoriatic Arthritis). It is associated with an increased risk of inflammatory bowel disease, heart disease, non-alcoholic fatty liver disease, and depression.  Treatments include light and laser treatments; topical medications; and systemic medications including oral and injectables.  Treatment Plan: Ketoconazole  2% shampoo Shampoo scalp, ears, brows and beard area. Alternate with Clobetasol  shampoo to scalp - Apply it to dry scalp  Clobetasol  solution Apply twice daily to scalp as needed until clear then 2-3 times per week for maintenance. Avoid face, groin, underarms.  TMC 0.1% oint Apply to forehead and ears  twice daily as needed until clear then 2 or 3 times per week for maintenance.  May consider systemic treatment on follow up if not improving. PSORIASIS    Return in about 3 months (around 06/20/2024) for Psoriasis.  I, Eliot Guernsey, CMA, am acting as scribe for Gopal Malter K, PA-C .   Documentation: I have reviewed the above documentation for accuracy and completeness, and I agree with the above.  Madaleine Simmon K, PA-C

## 2024-06-25 ENCOUNTER — Encounter: Payer: Self-pay | Admitting: Physician Assistant

## 2024-06-25 ENCOUNTER — Ambulatory Visit: Admitting: Physician Assistant

## 2024-06-25 DIAGNOSIS — L400 Psoriasis vulgaris: Secondary | ICD-10-CM

## 2024-06-25 MED ORDER — CLOBETASOL PROPIONATE 0.05 % EX SHAM: 1.0000 | MEDICATED_SHAMPOO | CUTANEOUS | 8 refills | Status: AC

## 2024-06-25 MED ORDER — CLOBETASOL PROPIONATE 0.05 % EX CREA: 1.0000 | TOPICAL_CREAM | Freq: Two times a day (BID) | CUTANEOUS | 8 refills | Status: AC

## 2024-06-25 MED ORDER — CLOBETASOL PROPIONATE 0.05 % EX SOLN: 1.0000 | Freq: Two times a day (BID) | CUTANEOUS | 8 refills | Status: AC

## 2024-06-25 NOTE — Progress Notes (Signed)
   Follow-Up Visit   Subjective  Jeffery Warren is a 23 y.o. male ESTABLISHED PATIENT who presents for the following: FOLLOW UP OF psoriasis  Patient present today for follow up visit for psoriasis. Patient was last evaluated on 03/20/24. At this visit patient was prescribed TMC 0.1% clobetasol  and ketoconazole  2%. Patient reports sxs are better. Patient denies medication changes. Very happy.   The following portions of the chart were reviewed this encounter and updated as appropriate: medications, allergies, medical history  Review of Systems:  No other skin or systemic complaints except as noted in HPI or Assessment and Plan.  Objective  Well appearing patient in no apparent distress; mood and affect are within normal limits.  A focused examination was performed of the following areas: Warren and scalp  Relevant exam findings are noted in the Assessment and Plan.    Assessment & Plan   PSORIASIS -- IMPROVED  - continue topical steroids as needed - refills provided  - patient would like to hold on systemic treatment at this time     PLAQUE PSORIASIS   Related Medications Apremilast  (OTEZLA ) 10 & 20 & 30 MG TBPK Sig: Initiate with a 5-day titration: Day 1: 10 mg orally in the morning Day 2: 10 mg orally in the morning and evening Day 3: 10 mg orally in the morning; 20 mg orally in the evening Day 4: 20 mg orally in the morning and evening Day 5: 20 mg orally in the morning; 30 mg orally in the evening Day 6 and thereafter: 30 mg orally twice daily (morning and evening) clobetasol  (TEMOVATE ) 0.05 % external solution Apply 1 Application topically 2 (two) times daily. Apply twice daily to scalp as needed until clear then 2-3 times per week for maintenance. Avoid Warren, groin, underarms. Clobetasol  Propionate 0.05 % shampoo Apply 1 application  topically as directed. Wash scalp alternating with Ketoconazole  shampoo. Apply to dry scalp then wash clobetasol  cream (TEMOVATE ) 0.05 % Apply  1 Application topically 2 (two) times daily.  Return in about 11 months (around 05/25/2025) for Psoriasis follow up.  I, Doyce Pan, CMA, am acting as scribe for Ader Fritze K, PA-C.   Documentation: I have reviewed the above documentation for accuracy and completeness, and I agree with the above.  Jonathin Heinicke K, PA-C

## 2024-06-25 NOTE — Patient Instructions (Signed)

## 2025-03-05 ENCOUNTER — Encounter: Payer: Self-pay | Admitting: Internal Medicine

## 2025-05-26 ENCOUNTER — Ambulatory Visit: Admitting: Physician Assistant
